# Patient Record
Sex: Female | Born: 1965
Health system: Southern US, Community
[De-identification: ages and names within clinical notes are randomized; demographics above are authoritative.]

## PROBLEM LIST (undated history)

## (undated) DIAGNOSIS — K5190 Ulcerative colitis, unspecified, without complications: Secondary | ICD-10-CM

## (undated) DIAGNOSIS — Z974 Presence of external hearing-aid: Secondary | ICD-10-CM

## (undated) HISTORY — DX: Presence of external hearing-aid: Z97.4

## (undated) HISTORY — DX: Ulcerative colitis, unspecified, without complications: K51.90

## (undated) HISTORY — PX: CERVICAL SPINE SURGERY: SHX589

---

## 1998-05-22 HISTORY — PX: BAND HEMORRHOIDECTOMY: SHX1213

## 1999-03-04 ENCOUNTER — Other Ambulatory Visit: Admission: RE | Admit: 1999-03-04 | Discharge: 1999-03-04 | Payer: Self-pay | Admitting: Obstetrics and Gynecology

## 1999-04-07 ENCOUNTER — Encounter (INDEPENDENT_AMBULATORY_CARE_PROVIDER_SITE_OTHER): Payer: Self-pay

## 1999-04-07 ENCOUNTER — Other Ambulatory Visit: Admission: RE | Admit: 1999-04-07 | Discharge: 1999-04-07 | Payer: Self-pay | Admitting: Obstetrics and Gynecology

## 2000-12-26 ENCOUNTER — Other Ambulatory Visit: Admission: RE | Admit: 2000-12-26 | Discharge: 2000-12-26 | Payer: Self-pay | Admitting: *Deleted

## 2001-02-14 ENCOUNTER — Other Ambulatory Visit: Admission: RE | Admit: 2001-02-14 | Discharge: 2001-02-14 | Payer: Self-pay | Admitting: *Deleted

## 2001-03-05 ENCOUNTER — Encounter (INDEPENDENT_AMBULATORY_CARE_PROVIDER_SITE_OTHER): Payer: Self-pay | Admitting: Specialist

## 2001-03-05 ENCOUNTER — Other Ambulatory Visit: Admission: RE | Admit: 2001-03-05 | Discharge: 2001-03-05 | Payer: Self-pay | Admitting: *Deleted

## 2001-08-29 ENCOUNTER — Other Ambulatory Visit: Admission: RE | Admit: 2001-08-29 | Discharge: 2001-08-29 | Payer: Self-pay | Admitting: Obstetrics and Gynecology

## 2002-01-30 ENCOUNTER — Other Ambulatory Visit: Admission: RE | Admit: 2002-01-30 | Discharge: 2002-01-30 | Payer: Self-pay | Admitting: Obstetrics and Gynecology

## 2003-10-28 ENCOUNTER — Inpatient Hospital Stay (HOSPITAL_COMMUNITY): Admission: RE | Admit: 2003-10-28 | Discharge: 2003-10-29 | Payer: Self-pay | Admitting: Neurosurgery

## 2003-12-16 ENCOUNTER — Encounter (INDEPENDENT_AMBULATORY_CARE_PROVIDER_SITE_OTHER): Payer: Self-pay | Admitting: *Deleted

## 2003-12-16 ENCOUNTER — Ambulatory Visit (HOSPITAL_COMMUNITY): Admission: RE | Admit: 2003-12-16 | Discharge: 2003-12-16 | Payer: Self-pay | Admitting: *Deleted

## 2005-04-11 ENCOUNTER — Emergency Department (HOSPITAL_COMMUNITY): Admission: EM | Admit: 2005-04-11 | Discharge: 2005-04-11 | Payer: Self-pay | Admitting: Emergency Medicine

## 2005-05-24 ENCOUNTER — Ambulatory Visit (HOSPITAL_COMMUNITY): Admission: RE | Admit: 2005-05-24 | Discharge: 2005-05-24 | Payer: Self-pay | Admitting: Gastroenterology

## 2005-06-16 ENCOUNTER — Encounter (INDEPENDENT_AMBULATORY_CARE_PROVIDER_SITE_OTHER): Payer: Self-pay | Admitting: Specialist

## 2005-06-16 ENCOUNTER — Ambulatory Visit (HOSPITAL_COMMUNITY): Admission: RE | Admit: 2005-06-16 | Discharge: 2005-06-16 | Payer: Self-pay | Admitting: General Surgery

## 2006-03-03 ENCOUNTER — Encounter: Admission: RE | Admit: 2006-03-03 | Discharge: 2006-03-03 | Payer: Self-pay | Admitting: Neurosurgery

## 2006-10-05 ENCOUNTER — Encounter: Admission: RE | Admit: 2006-10-05 | Discharge: 2006-10-05 | Payer: Self-pay | Admitting: Internal Medicine

## 2007-06-07 ENCOUNTER — Encounter: Admission: RE | Admit: 2007-06-07 | Discharge: 2007-06-07 | Payer: Self-pay | Admitting: Internal Medicine

## 2008-12-29 ENCOUNTER — Encounter: Admission: RE | Admit: 2008-12-29 | Discharge: 2008-12-29 | Payer: Self-pay | Admitting: Neurosurgery

## 2010-10-07 NOTE — Op Note (Signed)
NAME:  Beth Mendoza, [place]                 ACCOUNT NO.:  [id number]   MEDICAL RECORD NO.:  [id number]          PATIENT TYPE:  AMB   LOCATION:  ENDO                         FACILITY:  [facility]   PHYSICIAN:  Shirley Friar, MDDATE OF BIRTH:  Sep 26, 1965   DATE OF PROCEDURE:  DATE OF DISCHARGE:                                 OPERATIVE REPORT   INDICATIONS:  1.  Rectal pain.  2.  History of ulcerative colitis.   HISTORY OF PRESENT ILLNESS:  This is a 45 year old white female with left  sided ulcerative colitis, on Colazal, who presented in November 2006  secondary to acute onset of rectal pain without any evidence of rectal  bleeding or diarrhea.  Physical examination at that time revealed a tender  area in the anorectal junction, but no thrombosed hemorrhoids noted.  The  patient was placed on Canasa suppositories, and the pain did not subside,  and therefore, a possibility of colitis flare was questioned.  The patient  was placed on prednisone taper without any improvement, and this  sigmoidoscopy was being done to further evaluate.   MEDICATIONS:  1.  Demerol 70 mg IV.  2.  Versed 8 mg IV.   FINDINGS:  Perirectal exam revealed a medium sized thrombosed external  hemorrhoid with severe tenderness.  No fissures were noted.  The pediatric  adjustable colonoscope was carefully inserted into the rectum with  tenderness noted on insertion and advanced to 20 cm.  Mild ulcerative  colitis was noted from this point down to the rectum with decreased vascular  pattern, erythema and edema noted.  The walls were friable, but no frank  ulcerations were seen.  Retroflexion was carefully done, and due to severe  pain, the complete anorectal junction could not be sufficiently visualized,  but no internal hemorrhoids were seen during this maneuver.  The insufflated  air was aspirated and the endoscope was withdrawn.   ASSESSMENT:  1.  Thrombosed external hemorrhoid.  2.  Mild ulcerative colitis to 20  cm, colonoscope not advanced further due      to patient discomfort and realization that rectal pain was secondary to      hemorrhoids.   PLAN:  1.  The patient has been set up to see Dr. Lurene Shadow in [facility] today at 3:30 p.m., and instructions have been given to the      patient and her family.  This appointment has been scheduled for      management of his      hemorrhoids.  2.  The patient should continue Colazal for her ulcerative colitis.  3.  Will follow up with GI for her ulcerative colitis to be scheduled at a      later date.      Shirley Friar, MD  Electronically Signed     VCS/MEDQ  D:  05/24/2005  T:  05/24/2005  Job:  [id number]   cc:   Leonie Man, M.D.  [address]  [address]  [place]  [place] [address]

## 2010-10-07 NOTE — H&P (Signed)
NAME:  Beth Mendoza, Beth Mendoza                             ACCOUNT NO.:  [id number]   MEDICAL RECORD NO.:  [id number]                   PATIENT TYPE:  INP   LOCATION:  2852                                 FACILITY:  [facility]   PHYSICIAN:  Hilda Lias, M.D.                DATE OF BIRTH:  02/27/1966   DATE OF ADMISSION:  10/28/2003  DATE OF DISCHARGE:                                HISTORY & PHYSICAL   Ms. Atha is a lady who was seen by me yesterday around noon-time.  I  called her as an emergency to go immediately to my office because the  findings of the MRI.  This lady is a patient of Dr. Chrys Racer who had been  complaining of some neck pain, some feeling of weakness and heaviness in her  upper and lower extremities.  About nine years ago, she underwent a C5-C6  anterior cervical diskectomy.  The reason for my seeing this emergency is  because the MRI showed a large herniated disk at level of C6-C7 with  displacement of the spinal cord and producing already edema.  Because of  that, I told her that she needed surgery as an emergency.  I told her that I  was willing to do that last night.  Nevertheless, her husband was out of  town.  We agreed for her to go back to the office, not to drive if it was  raining and to go back home only when there was not a lot of traffic in the  area.   PAST MEDICAL HISTORY:  Anterior cervical diskectomy at the level of C5-C6 by  Dr. Newell Coral.  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   FAMILY HISTORY:  Father died of a heart attack at the age of 74.  Mother had  a stroke.   SOCIAL HISTORY:  The patient does not smoke.  She drinks socially.   REVIEW OF SYSTEMS:  Only positive for some discomfort going to the upper  extremities.   PHYSICAL EXAMINATION:  HEENT:  Normal.  NECK:  She is able to flex but extension and lateralization produces  discomfort going to the upper extremities.  LUNGS:  Clear.  HEART:  Sounds are normal.  ABDOMEN:  Normal.  EXTREMITIES:  Normal pulses.  NEURO:  Mental status:  Normal.  Cranial nerves:  Normal.  She has a lumbar  strain but she is a little bit off balance.  Coordination is normal.   This lady's MRI has a large herniated disk at the level of C6-C7 with  already changes within the spinal cord itself.  There is a question of  __________ C5-C6 where she had surgery before.   CLINICAL IMPRESSION:  1. C6-C7 herniated disk with already edema of the spinal cord.  2. Status post C5-C6 fusion before.   RECOMMENDATIONS:  The patient is being admitted for surgery as an emergency.  The procedure will be an  anterior of C6-C7.  The intervention will be either  awake or neutral.  The risks including the paralysis, finding a lot of scar  which could produce damage to the vocal cords, esophagus, or trachea, need  for further surgery, no improvement whatsoever.                                                Hilda Lias, M.D.    EB/MEDQ  D:  10/28/2003  T:  10/29/2003  Job:  [id number]

## 2010-10-07 NOTE — Op Note (Signed)
NAME:  Beth Mendoza, Beth Mendoza                 ACCOUNT NO.:  [id number]   MEDICAL RECORD NO.:  [id number]          PATIENT TYPE:  AMB   LOCATION:  DAY                          FACILITY:  [facility]   PHYSICIAN:  Leonie Man, M.D.   DATE OF BIRTH:  11-07-1965   DATE OF PROCEDURE:  06/16/2005  DATE OF DISCHARGE:                                 OPERATIVE REPORT   PREOPERATIVE DIAGNOSIS:  Hemorrhoids and perianal pain.   POSTOPERATIVE DIAGNOSIS:  Hemorrhoids and perianal pain.   PROCEDURE:  Examination under anesthesia and hemorrhoidectomy.   SURGEON:  Leonie Man, M.D.   ASSISTANT:  OR tech.   ANESTHESIA:  General.   SPECIMENS TO LAB:  Hemorrhoids.   ESTIMATED BLOOD LOSS:  Minimal.   COMPLICATIONS:  None. The patient to the PACU in excellent condition.   [name] is a 45 year old female with a history of ulcerative colitis. The  patient presented originally with severe anal pain. She is noted to have  external noninflamed hemorrhoidal disease. At the time of examination in the  office, it was not possible for [place] to do anoscopy on her because the pain  was so severe. She comes to the operating room now for examination under  anesthesia and for excision of hemorrhoids. She understands the risks and  potential benefits of surgery and gives her consent for same.   DESCRIPTION OF PROCEDURE:  Following the induction of satisfactory general  anesthesia, the patient is positioned in lithotomy position and the perianal  tissues are prepped and draped to be included in the sterile operative  field. The rigid proctosigmoidoscope is passed up through the anal canal up  to 15 cm where we could not proceed any further because of the presence of  solid stool. The proctoscope was withdrawn and the perianal tissues again  prepped. The anal verge was then dilated to 2 fingerbreadth's and the  operating anoscope was placed inside the rectum. The patient did have  internal hemorrhoids in addition to her  external hemorrhoids which I assume  was the source of her pain. The hemorrhoidal complexes were then infiltrated  with 0.25% Marcaine with epinephrine. A stitch was placed at the base of the  hemorrhoid with 3-0 chromic catgut and an elliptical incision was carried  down around the hemorrhoidal ring and on across the mucocutaneous junction  onto the skin. The hemorrhoid was located at both the 12 o'clock and 6  o'clock positions. These were dissected free from the underlying sphincter  muscle, removed and forwarded for pathologic evaluation. Again in the 6  o'clock position, the hemorrhoid was infiltrated with 0.25% Marcaine with  epinephrine and the stitch was placed at the base of the hemorrhoid just  below the dentate line and the elliptical incision made over the hemorrhoid  and the hemorrhoid excised from the underlying sphincter muscle and  forwarded for pathologic evaluation. The mucosa in the mucocutaneous  junction of both areas were then closed with a running suture of 3-0 chromic  catgut. All areas of dissection were checked for hemostasis  and noted to be dry placed. I placed  a pad of Gelfoam over each of the  incision lines. Sterile dressings were then applied, anesthetic reversed,  the patient removed from the operating room to the recovery room in stable  condition. She tolerated the procedure well.      Leonie Man, M.D.  Electronically Signed     PB/MEDQ  D:  06/16/2005  T:  06/17/2005  Job:  [id number]

## 2010-10-07 NOTE — Op Note (Signed)
NAME:  Beth Mendoza, Beth Mendoza NO.:  [id number]   MEDICAL RECORD NO.:  [id number]                   PATIENT TYPE:  INP   LOCATION:  2852                                 FACILITY:  [facility]   PHYSICIAN:  Hilda Lias, M.D.                DATE OF BIRTH:  March 15, 1966   DATE OF PROCEDURE:  10/28/2003  DATE OF DISCHARGE:                                 OPERATIVE REPORT   PREOPERATIVE DIAGNOSIS:  C6-C7 herniated disc with already changes within  the spinal cord itself, early myelopathy, status post anterior fusion C5-C6  nine years ago.   POSTOPERATIVE DIAGNOSIS:  C6-C7 herniated disc with already changes within  the spinal cord itself, early myelopathy, status post anterior fusion C5-C6  nine years ago.   PROCEDURE:  C6-C7 decompression of the spinal cord, interbody fusion with  allograft, plate from C6 to C7, microscope.   SURGEON:  Hilda Lias, M.D.   ASSISTANT:  Stefani Dama, M.D.   CLINICAL HISTORY:  The patient was admitted as an emergency because of the  findings of the MRI.  The patient has a large herniated disc with  displacement of the spinal cord.  Surgery was advised and the risks were  explained during the history and physical.   PROCEDURE:  The patient was taken to the OR and the intubation was done in a  neutral position.  There was no pillow under the shoulder to avoid any  hyperextension.  The neck was prepped with Betadine.  The previous scar was  resected and the incision was carried down through the subcutaneous tissue  and platysma.  We found some scar tissue but were  able to retract, without  any problem, the trachea and esophagus.  X-ray showed that we were at the  level of C6-C7.  We opened the C6-C7 space and we brought the microscope  into the area.  We removed a large amount of disc.  Indeed, the posterior  ligament was pushed about 3 mm away from the posterior part of the bone of  C6-C7.  There were adhesions between the  dural matter and the posterior  ligament from previous surgery.  The incision of the ligament was done and  removal of large material displacing the spinal cord was achieved.  The  spinal cord had a groove in the midline secondary to the pressure.  At the  end, after we decompressed the spinal cord, we did foraminotomy for the C7  nerve root.  At the end, the spinal cord was pulsatile.  Before we started  doing the dissection, we gave her intravenous  steroid.  Then, the plate was  drilled and allograft of 7 mm with DBX in the middle was used.  This was  followed by a plate using four screws.  Lateral C-spine showed good position  of the bone graft and the plate.  From then on,  the area was irrigated.  Hemostasis was done with bipolar.  We waited ten minutes to be sure there  was good hemostasis.  Having done this, the area was closed with Vicryl and  Steri-Strips.                                               Hilda Lias, M.D.    EB/MEDQ  D:  10/28/2003  T:  10/28/2003  Job:  [id number]

## 2011-01-05 ENCOUNTER — Other Ambulatory Visit (HOSPITAL_COMMUNITY): Payer: Self-pay | Admitting: Orthopedic Surgery

## 2011-01-05 DIAGNOSIS — S6990XA Unspecified injury of unspecified wrist, hand and finger(s), initial encounter: Secondary | ICD-10-CM

## 2011-01-05 DIAGNOSIS — D492 Neoplasm of unspecified behavior of bone, soft tissue, and skin: Secondary | ICD-10-CM

## 2011-01-12 ENCOUNTER — Encounter (HOSPITAL_COMMUNITY)
Admission: RE | Admit: 2011-01-12 | Discharge: 2011-01-12 | Disposition: A | Discharge status: Home / Self Care | Payer: Commercial Managed Care - PPO | Source: Ambulatory Visit | Attending: Orthopedic Surgery | Admitting: Orthopedic Surgery

## 2011-01-12 DIAGNOSIS — S6990XA Unspecified injury of unspecified wrist, hand and finger(s), initial encounter: Secondary | ICD-10-CM

## 2011-01-12 DIAGNOSIS — M7989 Other specified soft tissue disorders: Secondary | ICD-10-CM | POA: Insufficient documentation

## 2011-01-12 DIAGNOSIS — D492 Neoplasm of unspecified behavior of bone, soft tissue, and skin: Secondary | ICD-10-CM

## 2011-01-12 DIAGNOSIS — M79609 Pain in unspecified limb: Secondary | ICD-10-CM | POA: Insufficient documentation

## 2011-01-12 MED ORDER — TECHNETIUM TC 99M MEDRONATE IV KIT
26.4000 | PACK | Freq: Once | INTRAVENOUS | Status: AC | PRN
  Administered 2011-01-12: 26.4 via INTRAVENOUS

## 2014-03-04 ENCOUNTER — Other Ambulatory Visit: Payer: Self-pay

## 2014-03-04 DIAGNOSIS — Z1231 Encounter for screening mammogram for malignant neoplasm of breast: Secondary | ICD-10-CM

## 2014-03-31 ENCOUNTER — Ambulatory Visit
Admission: RE | Admit: 2014-03-31 | Discharge: 2014-03-31 | Disposition: A | Discharge status: Home / Self Care | Payer: Commercial Managed Care - PPO | Source: Ambulatory Visit

## 2014-03-31 DIAGNOSIS — Z1231 Encounter for screening mammogram for malignant neoplasm of breast: Secondary | ICD-10-CM

## 2015-03-25 ENCOUNTER — Other Ambulatory Visit: Payer: Self-pay | Admitting: Orthopaedic Surgery

## 2015-03-25 DIAGNOSIS — M545 Low back pain: Secondary | ICD-10-CM

## 2015-04-06 ENCOUNTER — Ambulatory Visit
Admission: RE | Admit: 2015-04-06 | Discharge: 2015-04-06 | Disposition: A | Discharge status: Home / Self Care | Payer: Commercial Managed Care - PPO | Source: Ambulatory Visit | Attending: Orthopaedic Surgery | Admitting: Orthopaedic Surgery

## 2015-04-06 DIAGNOSIS — M545 Low back pain: Secondary | ICD-10-CM

## 2016-04-05 ENCOUNTER — Ambulatory Visit (INDEPENDENT_AMBULATORY_CARE_PROVIDER_SITE_OTHER): Payer: Self-pay | Admitting: Orthopedic Surgery

## 2016-04-06 ENCOUNTER — Ambulatory Visit (INDEPENDENT_AMBULATORY_CARE_PROVIDER_SITE_OTHER): Payer: Commercial Managed Care - PPO

## 2016-04-06 ENCOUNTER — Ambulatory Visit (INDEPENDENT_AMBULATORY_CARE_PROVIDER_SITE_OTHER): Payer: Commercial Managed Care - PPO | Admitting: Orthopaedic Surgery

## 2016-04-06 ENCOUNTER — Encounter (INDEPENDENT_AMBULATORY_CARE_PROVIDER_SITE_OTHER): Payer: Self-pay | Admitting: Orthopaedic Surgery

## 2016-04-06 VITALS — BP 148/65 | HR 67 | Resp 12 | Ht 66.0 in | Wt 150.0 lb

## 2016-04-06 DIAGNOSIS — M79672 Pain in left foot: Secondary | ICD-10-CM | POA: Diagnosis not present

## 2016-04-06 DIAGNOSIS — M542 Cervicalgia: Secondary | ICD-10-CM | POA: Diagnosis not present

## 2016-04-06 MED ORDER — METHOCARBAMOL 500 MG PO TABS: 500.0000 mg | ORAL_TABLET | Freq: Three times a day (TID) | ORAL | 1 refills | Status: DC

## 2016-04-06 MED ORDER — METHOCARBAMOL 500 MG PO TABS: 500.0000 mg | ORAL_TABLET | Freq: Three times a day (TID) | ORAL | 1 refills | Status: AC

## 2016-04-06 NOTE — Progress Notes (Signed)
Office Visit Note   Patient: Beth Mendoza           Date of Birth: [birth year]           MRN: [id number] Visit Date: 04/06/2016              Requested by: No referring provider defined for this encounter. PCP: Merrilee Seashore, MD   Assessment & Plan: Visit Diagnoses: Prior successful fusion of C5-6 and C6-7 with degenerative changes at C4-5. There appears to be degenerative disc disease creating muscle spasm. I will prescribe Robaxin for muscle spasm and suggest cervical spine exercises. Left foot pain appears to be soft tissue in the area of the arch. There is no evidence of degenerative change in the midfoot or prior fracture.  Plan: Follow up as needed Follow-Up Instructions: No Follow-up on file.   Orders:  No orders of the defined types were placed in this encounter.  No orders of the defined types were placed in this encounter.     Procedures: No procedures performed   Clinical Data: No additional findings.   Subjective: Chief Complaint  Patient presents with  . Neck - Pain  . Left Foot - Pain    Left foot pain since June, 2017, arch pain, cleaning pool barefoot, turned, no swelling Cervical spine pain  worsening x 2 weeks, could not get out of bed, burning on right side, limited range of motion, pain radiating down right arm, First 3 digit numbness, 4th and 5th digit numbness when working out - ongoing - goes away after working out on [employer], contracting. Motrin and Advil at night for pain as needed - helps some.  Initial onset of neck pain was acute on the right side. Itzia was uncomfortable for at least 24 hours with slow subsidence of her pain. Presently she is not having stiffness or significant pain. She also was not experiencing any numbness or tingling in her right hand. She does have recurrent discomfort in the ulnar 2 digits of both areas when she works on elliptical. She's had a prior fusion at C5-6 and 1996 and a second fusion of C5-6-7 in  2010  Review of Systems   Objective: Vital Signs: BP (!) 148/65 (BP Location: Left Arm, Patient Position: Sitting, Cuff Size: Large)   Pulse 67   Resp 12   Ht 5\' 6"  (1.676 m)   Wt 150 lb (68 kg)   LMP 03/27/2016   BMI 24.21 kg/m   Physical Exam  Ortho Exam examination of the cervical spine reveal no evidence of skin change, ecchymosis or erythema.S there was no pain with rotation to the right or to the left. Specifically, there was no referred pain to either upper extremity with any motion of her neck. Se had difficulty touching her chin to her chest but that may be related to her old fusion. Neurologic exam was intact. She had good grip and release. Reflexes were symmetrical. She had some mild tenderness over the ulnar nerve at both elbows more on the left than the right. There was no specific abnormality referable to the ulnar nerve No specialty comments available.  Imaging: No results found.   PMFS History: There are no active problems to display for this patient.  Past Medical History:  Diagnosis Date  . Hearing aid worn   . Ulcerative colitis ([place])     Family History  Problem Relation Age of Onset  . Stroke Mother   . Heart attack Father   . Liver disease  Sister     Past Surgical History:  Procedure Laterality Date  . BAND HEMORRHOIDECTOMY  2000  . [place], 2006   Social History   Occupational History  . Not on file.   Social History Main Topics  . Smoking status: Former Smoker    Years: 15.00    Types: Cigarettes    Quit date: 04/06/2001  . Smokeless tobacco: Never Used  . Alcohol use 1.2 oz/week    1 Glasses of wine, 1 Shots of liquor per week  . Drug use: No  . Sexual activity: Not on file

## 2016-11-22 DIAGNOSIS — S93601A Unspecified sprain of right foot, initial encounter: Secondary | ICD-10-CM | POA: Diagnosis not present

## 2016-11-22 DIAGNOSIS — M79671 Pain in right foot: Secondary | ICD-10-CM | POA: Diagnosis not present

## 2017-04-24 DIAGNOSIS — Z0000 Encounter for general adult medical examination without abnormal findings: Secondary | ICD-10-CM | POA: Diagnosis not present

## 2017-05-10 DIAGNOSIS — D7589 Other specified diseases of blood and blood-forming organs: Secondary | ICD-10-CM | POA: Diagnosis not present

## 2017-05-10 DIAGNOSIS — K5130 Ulcerative (chronic) rectosigmoiditis without complications: Secondary | ICD-10-CM | POA: Diagnosis not present

## 2017-05-10 DIAGNOSIS — Z0000 Encounter for general adult medical examination without abnormal findings: Secondary | ICD-10-CM | POA: Diagnosis not present

## 2017-07-05 DIAGNOSIS — Z23 Encounter for immunization: Secondary | ICD-10-CM | POA: Diagnosis not present

## 2017-07-05 DIAGNOSIS — M25562 Pain in left knee: Secondary | ICD-10-CM | POA: Diagnosis not present

## 2017-07-05 DIAGNOSIS — S8992XA Unspecified injury of left lower leg, initial encounter: Secondary | ICD-10-CM | POA: Diagnosis not present

## 2017-07-05 DIAGNOSIS — S81012A Laceration without foreign body, left knee, initial encounter: Secondary | ICD-10-CM | POA: Diagnosis not present

## 2017-12-13 ENCOUNTER — Ambulatory Visit (INDEPENDENT_AMBULATORY_CARE_PROVIDER_SITE_OTHER): Payer: Commercial Managed Care - PPO

## 2017-12-13 ENCOUNTER — Encounter (INDEPENDENT_AMBULATORY_CARE_PROVIDER_SITE_OTHER): Payer: Self-pay | Admitting: Orthopaedic Surgery

## 2017-12-13 ENCOUNTER — Ambulatory Visit (INDEPENDENT_AMBULATORY_CARE_PROVIDER_SITE_OTHER): Payer: Commercial Managed Care - PPO | Admitting: Orthopaedic Surgery

## 2017-12-13 VITALS — BP 142/73 | HR 72 | Ht 65.5 in | Wt 145.0 lb

## 2017-12-13 DIAGNOSIS — M25511 Pain in right shoulder: Secondary | ICD-10-CM | POA: Diagnosis not present

## 2017-12-13 MED ORDER — DICLOFENAC SODIUM 1 % TD GEL: TRANSDERMAL | 4 refills | Status: AC

## 2017-12-13 MED ORDER — METHYLPREDNISOLONE ACETATE 40 MG/ML IJ SUSP
80.0000 mg | INTRAMUSCULAR | Status: AC | PRN
  Administered 2017-12-13: 80 mg

## 2017-12-13 MED ORDER — BUPIVACAINE HCL 0.5 % IJ SOLN
2.0000 mL | INTRAMUSCULAR | Status: AC | PRN
  Administered 2017-12-13: 2 mL via INTRA_ARTICULAR

## 2017-12-13 MED ORDER — LIDOCAINE HCL 2 % IJ SOLN
2.0000 mL | INTRAMUSCULAR | Status: AC | PRN
  Administered 2017-12-13: 2 mL

## 2017-12-13 NOTE — Progress Notes (Signed)
Office Visit Note   Patient: Beth Mendoza           Date of Birth: 1965/11/27           MRN: [id number] Visit Date: 12/13/2017              Requested by: Beth Mendoza, [address] [place] [place], [place] [address] PCP: Beth Seashore, MD   Assessment & Plan: Visit Diagnoses:  1. Acute pain of right shoulder     Plan: Positive impingement syndrome with early adhesive capsulitis.  Will inject the subacromial space and monitor response.  Instructed in exercises.  If no improvement over the next 3 to 4 weeks consider an MRI scan to rule out rotator cuff tear.  Discussed appropriate NSAID dosages.  Follow-Up Instructions: Return in about 1 month (around 01/13/2018).   Orders:  Orders Placed This Encounter  Procedures  . Large Joint Inj: R subacromial bursa  . XR Shoulder Right   Meds ordered this encounter  Medications  . diclofenac sodium (VOLTAREN) 1 % GEL    Sig: Apply to large joint TID PRN    Dispense:  3 Tube    Refill:  4      Procedures: Large Joint Inj: R subacromial bursa on 12/13/2017 1:41 PM Indications: pain and diagnostic evaluation Details: 25 G 1.5 in needle, anterolateral approach  Arthrogram: No  Medications: 2 mL lidocaine 2 %; 2 mL bupivacaine 0.5 %; 80 mg methylPREDNISolone acetate 40 MG/ML Consent was given by the patient. Immediately prior to procedure a time out was called to verify the correct patient, procedure, equipment, support staff and site/side marked as required. Patient was prepped and draped in the usual sterile fashion.       Clinical Data: No additional findings.   Subjective: Chief Complaint  Patient presents with  . New Patient (Initial Visit)    R SHOULDER PAIN 07/2017 HELPING PUSH HEAVY OBJECT UP [name]  Beth Mendoza is a 52 years old visited the office for evaluation of right shoulder pain.  She was helping her husband move an elliptical machine he felt "something" happened in her right shoulder.  She is  been having trouble ever since that time occult he sleeping and raising her arm over her head.  She has not experienced numbness or tingling.  Having some difficulty with overhead activity.  HPI  Review of Systems  Constitutional: Negative for fatigue and fever.  HENT: Negative for ear pain.   Eyes: Negative for pain.  Respiratory: Negative for cough and shortness of breath.   Cardiovascular: Negative for leg swelling.  Gastrointestinal: Negative for constipation and diarrhea.  Genitourinary: Negative for difficulty urinating.  Musculoskeletal: Positive for neck pain. Negative for back pain.  Skin: Negative for rash.  Allergic/Immunologic: Negative for food allergies.  Neurological: Positive for weakness. Negative for numbness.  Hematological: Bruises/bleeds easily.  Psychiatric/Behavioral: Positive for sleep disturbance.     Objective: Vital Signs: BP (!) 142/73 (BP Location: Left Arm, Patient Position: Sitting, Cuff Size: Normal)   Pulse 72   Ht 5' 5.5" (1.664 m)   Wt 145 lb (65.8 kg)   BMI 23.76 kg/m   Physical Exam  Constitutional: She is oriented to person, place, and time. She appears well-developed and well-nourished.  HENT:  Mouth/Throat: Oropharynx is clear and moist.  Eyes: Pupils are equal, round, and reactive to light. EOM are normal.  Pulmonary/Chest: Effort normal.  Neurological: She is alert and oriented to person, place, and time.  Skin: Skin  is warm and dry.  Psychiatric: She has a normal mood and affect. Her behavior is normal.    Ortho Exam awake alert and oriented x3.  Comfortable sitting.  Positive impingement and empty can testing right shoulder.  Skin intact.  Biceps intact.  Does have some limitation of internal rotation and overhead motion.  She lacked about 40 to 45 degrees of full overhead motion passively with pain.  Good grip and good release.  No pain with motion of the cervical spine. After subacromial cortisone injection she had considerably  improved motion and pain but still lacks full overhead motion.  Specialty Comments:  No specialty comments available.  Imaging: Xr Shoulder Right  Result Date: 12/13/2017 To the right shoulder obtained in several projections.  Noted obvious degenerative changes of the acromioclavicular joint.  No ectopic calcification.  No acute changes.  There is centered about the glenoid.  Joint space is well-maintained.  Some downsloping of the acromion but a normal space between the humeral head and the acromium    PMFS History: There are no active problems to display for this patient.  Past Medical History:  Diagnosis Date  . Hearing aid worn   . Ulcerative colitis ([place])     Family History  Problem Relation Age of Onset  . Stroke Mother   . Heart attack Father   . Liver disease Sister     Past Surgical History:  Procedure Laterality Date  . BAND HEMORRHOIDECTOMY  2000  . [place], 2006   Social History   Occupational History  . Not on file  Tobacco Use  . Smoking status: Former Smoker    Years: 15.00    Types: Cigarettes    Last attempt to quit: 04/06/2001    Years since quitting: 16.6  . Smokeless tobacco: Never Used  Substance and Sexual Activity  . Alcohol use: Yes    Alcohol/week: 1.2 oz    Types: 1 Glasses of wine, 1 Shots of liquor per week  . Drug use: No  . Sexual activity: Not on file

## 2018-01-22 ENCOUNTER — Telehealth (INDEPENDENT_AMBULATORY_CARE_PROVIDER_SITE_OTHER): Payer: Self-pay | Admitting: Orthopaedic Surgery

## 2018-01-22 NOTE — Telephone Encounter (Signed)
Patient left a voicemail stating that she would like Dr. Durward Fortes to send in the referral for her to have an MRI of her shoulder

## 2018-01-22 NOTE — Telephone Encounter (Signed)
Please advise 

## 2018-01-23 ENCOUNTER — Other Ambulatory Visit (INDEPENDENT_AMBULATORY_CARE_PROVIDER_SITE_OTHER): Payer: Self-pay | Admitting: Radiology

## 2018-01-23 DIAGNOSIS — M25511 Pain in right shoulder: Secondary | ICD-10-CM

## 2018-01-23 NOTE — Telephone Encounter (Signed)
SENT REFERRAL IN

## 2018-01-23 NOTE — Telephone Encounter (Signed)
Ok to schedule.

## 2018-01-25 ENCOUNTER — Telehealth (INDEPENDENT_AMBULATORY_CARE_PROVIDER_SITE_OTHER): Payer: Self-pay | Admitting: Orthopaedic Surgery

## 2018-01-25 ENCOUNTER — Other Ambulatory Visit (INDEPENDENT_AMBULATORY_CARE_PROVIDER_SITE_OTHER): Payer: Self-pay | Admitting: Radiology

## 2018-01-25 NOTE — Telephone Encounter (Signed)
PLEASE ADVISE.

## 2018-01-25 NOTE — Telephone Encounter (Signed)
Patient called stating Dr. Durward Fortes wanted her to call back if when she is ready to schedule her MRI.  Patient requested a return call.

## 2018-01-25 NOTE — Telephone Encounter (Signed)
Please schedule MRI of right shoulder

## 2018-01-25 NOTE — Telephone Encounter (Signed)
REFERRAL PUT IN WAITING ON INSURANCE TO APPROVE IT

## 2018-02-05 ENCOUNTER — Telehealth (INDEPENDENT_AMBULATORY_CARE_PROVIDER_SITE_OTHER): Payer: Self-pay | Admitting: Orthopaedic Surgery

## 2018-02-05 NOTE — Telephone Encounter (Signed)
Patient called checking on the status of her MRI.

## 2018-02-05 NOTE — Telephone Encounter (Signed)
I called patient, patient will call [facility] to schedule MRI

## 2018-02-15 ENCOUNTER — Ambulatory Visit
Admission: RE | Admit: 2018-02-15 | Discharge: 2018-02-15 | Disposition: A | Discharge status: Home / Self Care | Payer: Commercial Managed Care - PPO | Source: Ambulatory Visit | Attending: Orthopaedic Surgery | Admitting: Orthopaedic Surgery

## 2018-02-15 ENCOUNTER — Other Ambulatory Visit (INDEPENDENT_AMBULATORY_CARE_PROVIDER_SITE_OTHER): Payer: Self-pay | Admitting: *Deleted

## 2018-02-15 DIAGNOSIS — M25511 Pain in right shoulder: Principal | ICD-10-CM

## 2018-02-15 DIAGNOSIS — G8929 Other chronic pain: Secondary | ICD-10-CM

## 2018-02-21 ENCOUNTER — Encounter (INDEPENDENT_AMBULATORY_CARE_PROVIDER_SITE_OTHER): Payer: Self-pay | Admitting: Orthopaedic Surgery

## 2018-02-21 ENCOUNTER — Ambulatory Visit (INDEPENDENT_AMBULATORY_CARE_PROVIDER_SITE_OTHER): Payer: Commercial Managed Care - PPO | Admitting: Orthopaedic Surgery

## 2018-02-21 VITALS — BP 124/69 | HR 68 | Ht 65.5 in | Wt 145.0 lb

## 2018-02-21 DIAGNOSIS — G8929 Other chronic pain: Secondary | ICD-10-CM | POA: Diagnosis not present

## 2018-02-21 DIAGNOSIS — M25511 Pain in right shoulder: Secondary | ICD-10-CM | POA: Diagnosis not present

## 2018-02-21 NOTE — Progress Notes (Signed)
Office Visit Note   Patient: Beth Mendoza           Date of Birth: December 17, 1965           MRN: [id number] Visit Date: 02/21/2018              Requested by: Merrilee Seashore, [address], [place] [id number] PCP: Merrilee Seashore, MD   Assessment & Plan: Visit Diagnoses:  1. Chronic right shoulder pain     Plan: Adhesive capsulitis right shoulder.  Long discussion regarding MRI scan findings which were minimal.  Long discussion regarding different treatment options.  I think the best approach would be closed manipulation with follow-up physical therapy.  Beth Mendoza would like to proceed.  We will schedule at her convenience  Follow-Up Instructions: Return will schedule right shoulder manipulation.   Orders:  No orders of the defined types were placed in this encounter.  No orders of the defined types were placed in this encounter.     Procedures: No procedures performed   Clinical Data: No additional findings.   Subjective: Chief Complaint  Patient presents with  . Follow-up    MRI REVIEW R SHOULDER  Beth Mendoza has been having trouble with her right shoulder since March.  She is developed significant limitation of motion consistent with adhesive capsulitis.  Poor response to time medicine and cortisone injection I ordered an MRI scan this revealed very mild degenerative AC joint arthropathy with subcortical marrow edema and mild spurring.  There was mild degenerative chondral thinning in the glenohumeral joint and mild distal  subscapularis tendinopathy.  Biggest problem is limitation of motion associated with pain.  HPI  Review of Systems  Constitutional: Negative for fatigue and fever.  HENT: Negative for ear pain.   Eyes: Negative for pain.  Respiratory: Negative for cough and shortness of breath.   Cardiovascular: Negative for leg swelling.  Gastrointestinal: Negative for constipation and diarrhea.  Genitourinary: Negative for difficulty  urinating.  Musculoskeletal: Negative for back pain and neck pain.  Skin: Negative for rash.  Neurological: Positive for weakness. Negative for numbness.  Hematological: Does not bruise/bleed easily.  Psychiatric/Behavioral: Positive for sleep disturbance.     Objective: Vital Signs: BP 124/69 (BP Location: Left Arm, Patient Position: Sitting, Cuff Size: Normal)   Pulse 68   Ht 5' 5.5" (1.664 m)   Wt 145 lb (65.8 kg)   BMI 23.76 kg/m   Physical Exam  Constitutional: She is oriented to person, place, and time. She appears well-developed and well-nourished.  HENT:  Mouth/Throat: Oropharynx is clear and moist.  Eyes: Pupils are equal, round, and reactive to light. EOM are normal.  Pulmonary/Chest: Effort normal.  Neurological: She is alert and oriented to person, place, and time.  Skin: Skin is warm and dry.  Psychiatric: She has a normal mood and affect. Her behavior is normal.    Ortho Exam awake alert and oriented x3.  Comfortable sitting.  Significant loss of motion with pain on the limits of motion.  She can flex about 100 degrees.  Abduct about 80 degrees.  Pain beyond 35 to 40 degrees of external rotation.  Good grip and good release.  No localized areas of tenderness with her arm at her side.  Neurologically intact.  Skin intact. Specialty Comments:  No specialty comments available.  Imaging: No results found.   PMFS History: There are no active problems to display for this patient.  Past Medical History:  Diagnosis Date  . Hearing  aid worn   . Ulcerative colitis ([place])     Family History  Problem Relation Age of Onset  . Stroke Mother   . Heart attack Father   . Liver disease Sister     Past Surgical History:  Procedure Laterality Date  . BAND HEMORRHOIDECTOMY  2000  . [place], 2006   Social History   Occupational History  . Not on file  Tobacco Use  . Smoking status: Former Smoker    Years: 15.00    Types: Cigarettes    Last  attempt to quit: 04/06/2001    Years since quitting: 16.8  . Smokeless tobacco: Never Used  Substance and Sexual Activity  . Alcohol use: Yes    Alcohol/week: 2.0 standard drinks    Types: 1 Glasses of wine, 1 Shots of liquor per week  . Drug use: No  . Sexual activity: Not on file

## 2018-03-08 DIAGNOSIS — M25611 Stiffness of right shoulder, not elsewhere classified: Secondary | ICD-10-CM | POA: Diagnosis not present

## 2018-03-08 DIAGNOSIS — M7501 Adhesive capsulitis of right shoulder: Secondary | ICD-10-CM | POA: Diagnosis not present

## 2018-03-08 DIAGNOSIS — M6281 Muscle weakness (generalized): Secondary | ICD-10-CM | POA: Diagnosis not present

## 2018-03-13 DIAGNOSIS — M7501 Adhesive capsulitis of right shoulder: Secondary | ICD-10-CM | POA: Diagnosis not present

## 2018-03-13 DIAGNOSIS — M25611 Stiffness of right shoulder, not elsewhere classified: Secondary | ICD-10-CM | POA: Diagnosis not present

## 2018-03-13 DIAGNOSIS — M6281 Muscle weakness (generalized): Secondary | ICD-10-CM | POA: Diagnosis not present

## 2018-03-15 ENCOUNTER — Inpatient Hospital Stay (INDEPENDENT_AMBULATORY_CARE_PROVIDER_SITE_OTHER): Payer: Commercial Managed Care - PPO | Admitting: Orthopaedic Surgery

## 2018-03-15 DIAGNOSIS — M7501 Adhesive capsulitis of right shoulder: Secondary | ICD-10-CM | POA: Diagnosis not present

## 2018-03-15 DIAGNOSIS — M6281 Muscle weakness (generalized): Secondary | ICD-10-CM | POA: Diagnosis not present

## 2018-03-15 DIAGNOSIS — M25611 Stiffness of right shoulder, not elsewhere classified: Secondary | ICD-10-CM | POA: Diagnosis not present

## 2018-03-18 ENCOUNTER — Inpatient Hospital Stay (INDEPENDENT_AMBULATORY_CARE_PROVIDER_SITE_OTHER): Payer: Commercial Managed Care - PPO | Admitting: Orthopaedic Surgery

## 2018-03-20 DIAGNOSIS — M25611 Stiffness of right shoulder, not elsewhere classified: Secondary | ICD-10-CM | POA: Diagnosis not present

## 2018-03-20 DIAGNOSIS — M6281 Muscle weakness (generalized): Secondary | ICD-10-CM | POA: Diagnosis not present

## 2018-03-20 DIAGNOSIS — M7501 Adhesive capsulitis of right shoulder: Secondary | ICD-10-CM | POA: Diagnosis not present

## 2018-03-22 DIAGNOSIS — M6281 Muscle weakness (generalized): Secondary | ICD-10-CM | POA: Diagnosis not present

## 2018-03-22 DIAGNOSIS — M25611 Stiffness of right shoulder, not elsewhere classified: Secondary | ICD-10-CM | POA: Diagnosis not present

## 2018-03-22 DIAGNOSIS — M7501 Adhesive capsulitis of right shoulder: Secondary | ICD-10-CM | POA: Diagnosis not present

## 2019-07-13 IMAGING — MR MR SHOULDER*R* W/O CM
4 of 5 series · 29 of 40 positions shown · non-contrast
Comparison: 12/13/2017 radiographs

CLINICAL DATA: Right shoulder pain and limited range of motion
after lifting injury 6 months ago.

EXAM:
MRI OF THE RIGHT SHOULDER WITHOUT CONTRAST
TECHNIQUE: Multiplanar, multisequence MR imaging of the shoulder was performed.
No intravenous contrast was administered.

[Series 4: PD fat-sat · axial · 4.0mm · 0.27mm/px · z∈[-27,+71]mm · 8 of 22 slices shown (1 of 2)]
[im 1/22]
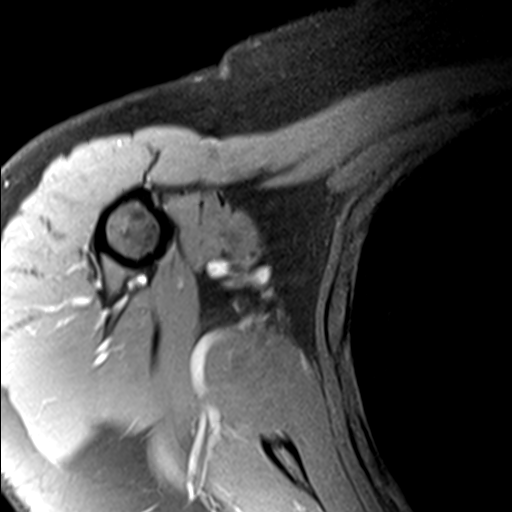
[im 3/22]
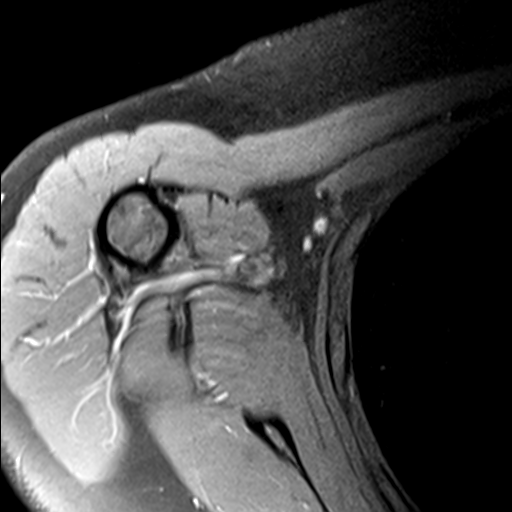
[im 8/22]
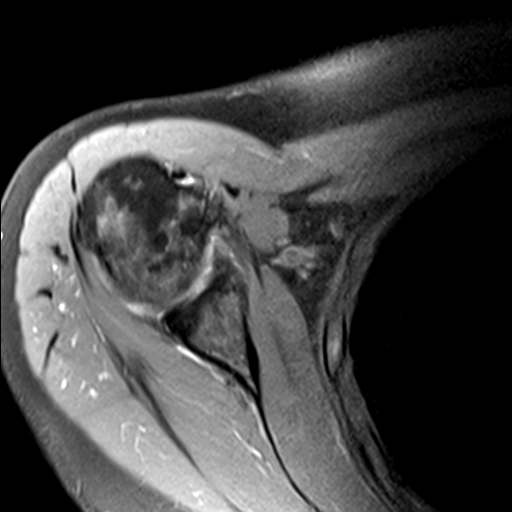
[im 10/22]
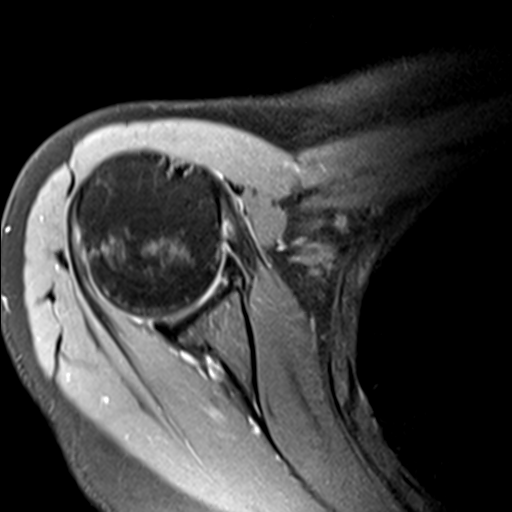
[im 12/22]
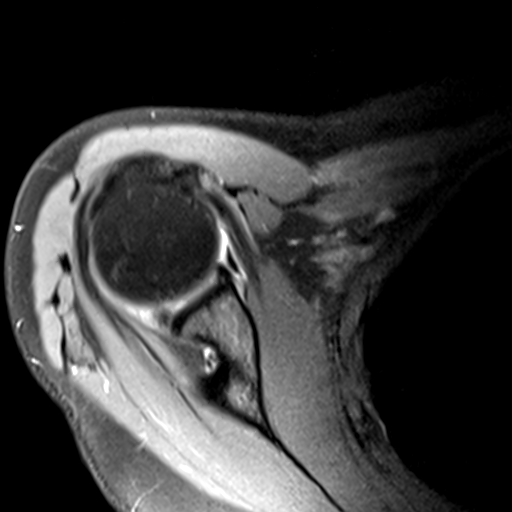
[im 15/22]
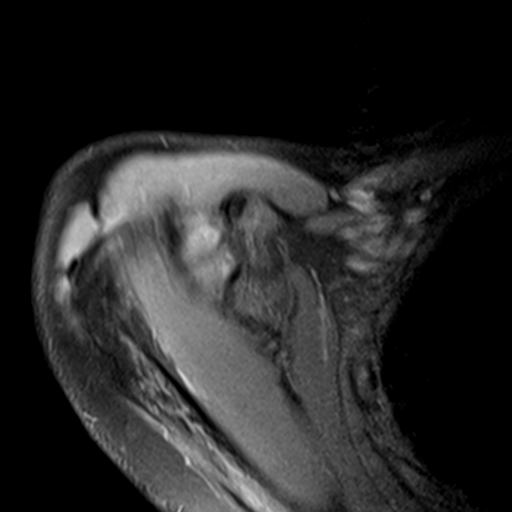
[im 19/22]
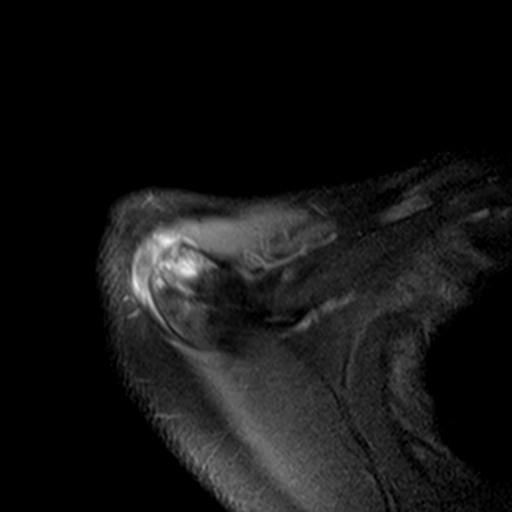
[im 22/22]
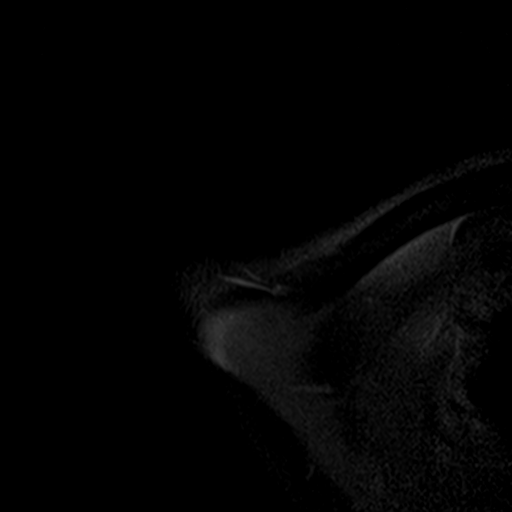

[Series 5: T2 fat-sat · oblique · 4.0mm · 0.55mm/px · 8 of 19 slices shown (1 of 2)]
[im 1/19]
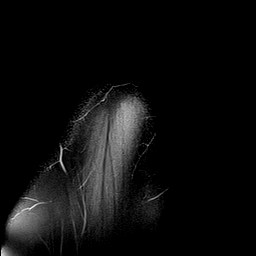
[im 3/19]
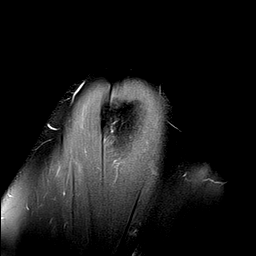
[im 6/19]
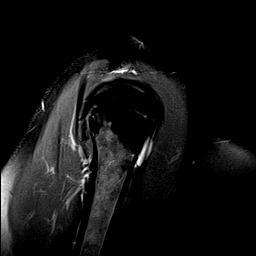
[im 8/19]
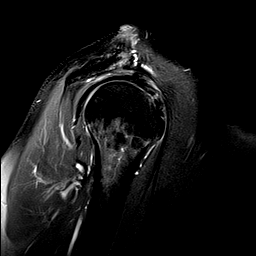
[im 11/19]
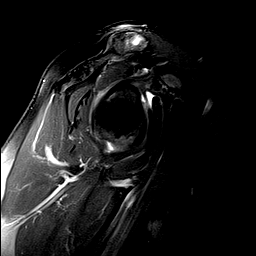
[im 13/19]
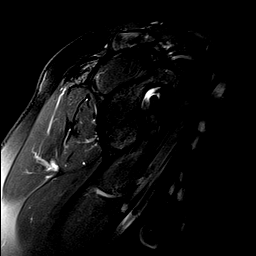
[im 16/19]
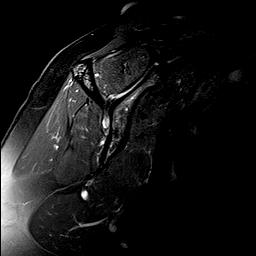
[im 19/19]
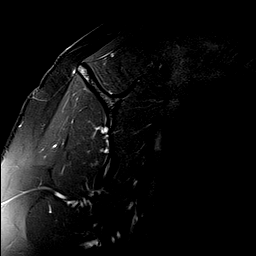

[Series 7: T2 fat-sat · sagittal · 4.0mm · 0.55mm/px · 6 of 15 slices shown (2 of 2)]
[im 1/15]
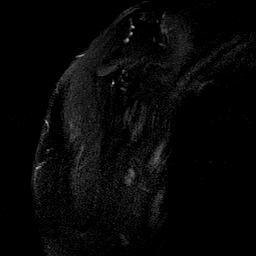
[im 3/15]
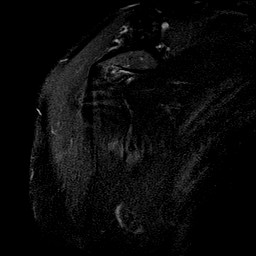
[im 5/15]
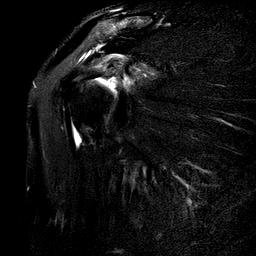
[im 8/15]
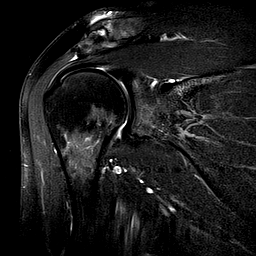
[im 10/15]
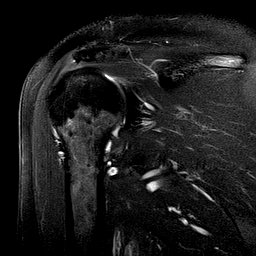
[im 12/15]
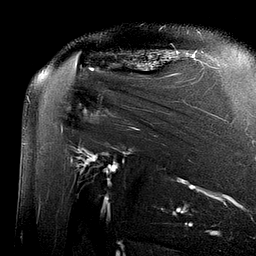

[Series 8: PD fat-sat · sagittal · 4.0mm · 0.27mm/px · 7 of 15 slices shown (2 of 2)]
[im 1/15]
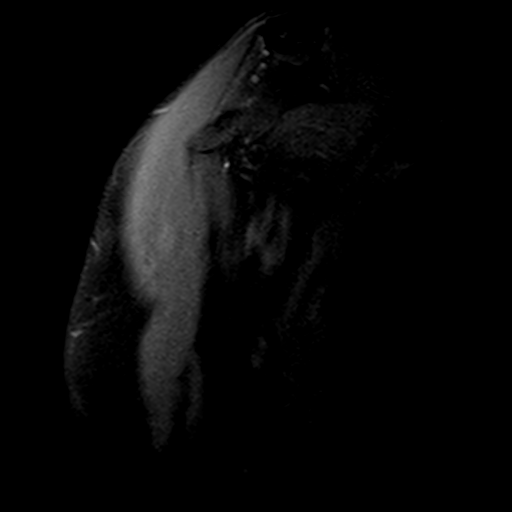
[im 3/15]
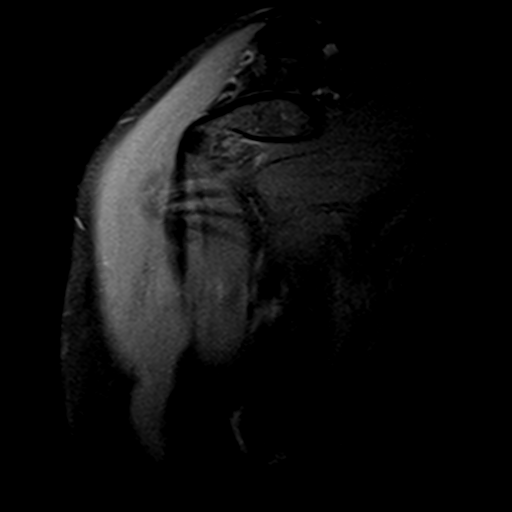
[im 5/15]
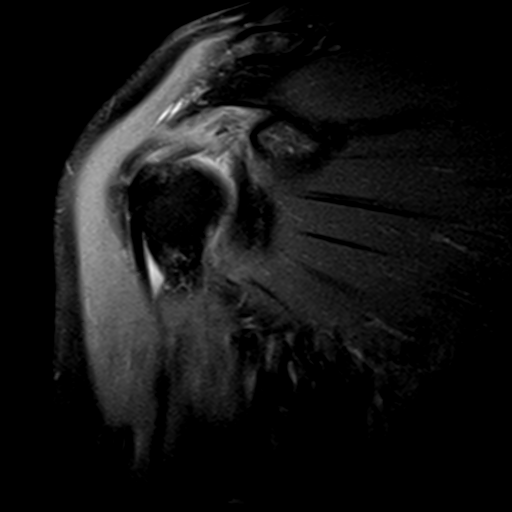
[im 8/15]
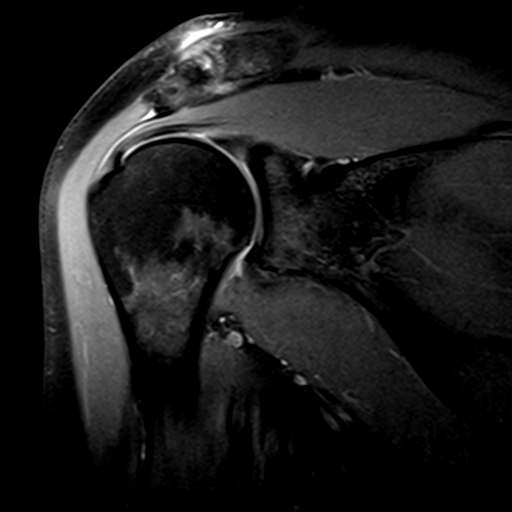
[im 10/15]
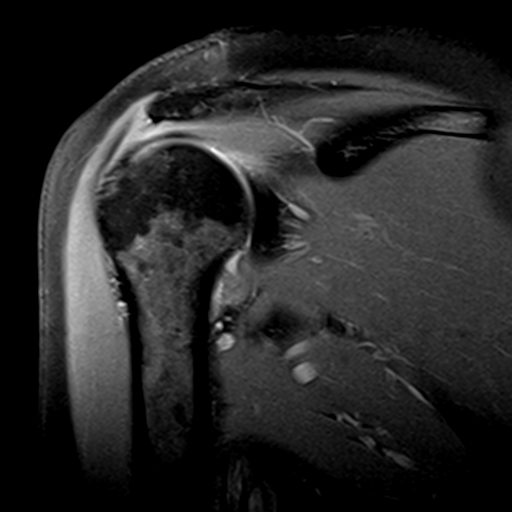
[im 12/15]
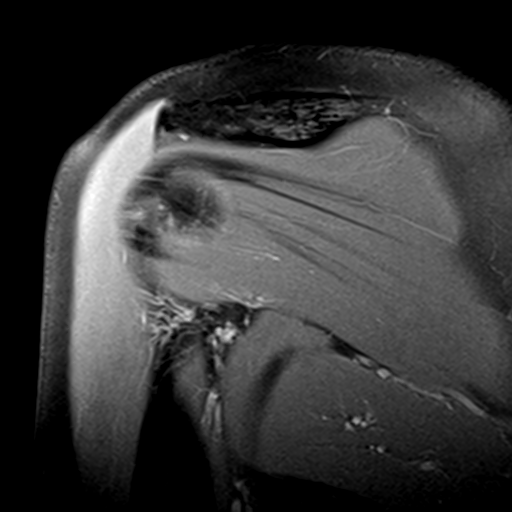
[im 15/15]
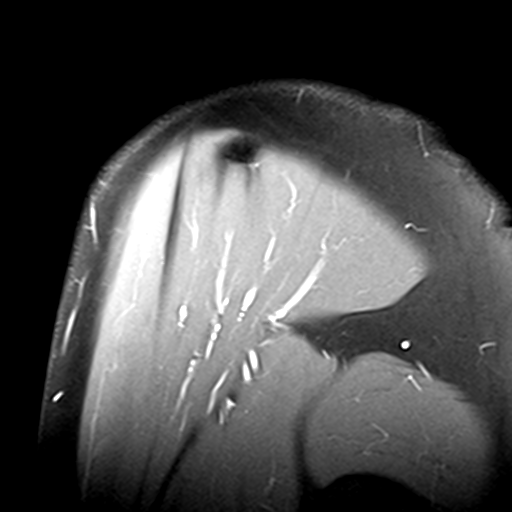

[29 of 40 positions shown; findings below may reference images not displayed]

FINDINGS: Rotator cuff:  Mild distal subscapularis tendinopathy.

Muscles:  Unremarkable

Biceps long head:  Unremarkable

Acromioclavicular Joint: Mild subcortical marrow edema in the distal
clavicle. Mild AC joint spurring. Type I acromion. No abnormal fluid
in the subacromial subdeltoid bursa.

Glenohumeral Joint: Mild degenerative chondral thinning.

Labrum:  Grossly unremarkable

Bones: No significant extra-articular osseous abnormalities
identified.

Other: No supplemental non-categorized findings.
IMPRESSION: 1. Mild degenerative AC joint arthropathy with subcortical marrow
edema and mild spurring.
2. Mild degenerative chondral thinning in the glenohumeral joint.
3. Mild distal subscapularis tendinopathy.
4. No other significant internal derangement to explain the
patient's persistent shoulder pain and reduced range of motion.

## 2019-08-02 ENCOUNTER — Ambulatory Visit: Payer: 59 | Attending: Internal Medicine

## 2019-08-02 DIAGNOSIS — Z23 Encounter for immunization: Secondary | ICD-10-CM

## 2019-08-02 NOTE — Progress Notes (Signed)
   Covid-19 Vaccination Clinic  Name:  Beth Mendoza    MRN: [id number] DOB: 1965/10/09  08/02/2019  [name] was observed post Covid-19 immunization for 15 minutes without incident. She was provided with Vaccine Information Sheet and instruction to access the V-Safe system.   [name] was instructed to call 911 with any severe reactions post vaccine: [place] Kitchen Difficulty breathing  . Swelling of face and throat  . A fast heartbeat  . A bad rash all over body  . Dizziness and weakness   Immunizations Administered    Name Date Dose VIS Date Route   Pfizer COVID-19 Vaccine 08/02/2019 12:39 PM 0.3 mL 05/02/2019 Intramuscular   Manufacturer: [place]   Lot: [id number]   [place]: [id number]

## 2019-08-26 ENCOUNTER — Ambulatory Visit: Payer: 59 | Attending: Internal Medicine

## 2019-08-26 DIAGNOSIS — Z23 Encounter for immunization: Secondary | ICD-10-CM

## 2019-08-26 NOTE — Progress Notes (Signed)
   Covid-19 Vaccination Clinic  Name:  Beth Mendoza    MRN: [id number] DOB: 04-17-1966  08/26/2019  [name] was observed post Covid-19 immunization for 15 minutes without incident. She was provided with Vaccine Information Sheet and instruction to access the V-Safe system.   [name] was instructed to call 911 with any severe reactions post vaccine: Mar[place] Kitchen Difficulty breathing  . Swelling of face and throat  . A fast heartbeat  . A bad rash all over body  . Dizziness and weakness   Immunizations Administered    Name Date Dose VIS Date Route   Pfizer COVID-19 Vaccine 08/26/2019 12:22 PM 0.3 mL 05/02/2019 Intramuscular   Manufacturer: [place]   Lot: [id number]   [place]: [id number]
